# Patient Record
Sex: Female | Born: 1965 | Race: White | Hispanic: No | Marital: Married | Smoking: Never smoker
Health system: Southern US, Community
[De-identification: ages and names within clinical notes are randomized; demographics above are authoritative.]

## PROBLEM LIST (undated history)

## (undated) DIAGNOSIS — Z90710 Acquired absence of both cervix and uterus: Secondary | ICD-10-CM

## (undated) DIAGNOSIS — Z8249 Family history of ischemic heart disease and other diseases of the circulatory system: Secondary | ICD-10-CM

## (undated) DIAGNOSIS — R55 Syncope and collapse: Secondary | ICD-10-CM

## (undated) DIAGNOSIS — I951 Orthostatic hypotension: Secondary | ICD-10-CM

## (undated) HISTORY — DX: Acquired absence of both cervix and uterus: Z90.710

## (undated) HISTORY — DX: Family history of ischemic heart disease and other diseases of the circulatory system: Z82.49

## (undated) HISTORY — PX: TUBAL LIGATION: SHX77

## (undated) HISTORY — PX: TYMPANOSTOMY TUBE PLACEMENT: SHX32

## (undated) HISTORY — DX: Syncope and collapse: R55

## (undated) HISTORY — PX: TONSILLECTOMY: SHX5217

## (undated) HISTORY — DX: Orthostatic hypotension: I95.1

---

## 1998-12-22 ENCOUNTER — Ambulatory Visit (HOSPITAL_COMMUNITY): Admission: RE | Admit: 1998-12-22 | Discharge: 1998-12-22 | Payer: Self-pay | Admitting: Obstetrics and Gynecology

## 1999-08-07 ENCOUNTER — Other Ambulatory Visit: Admission: RE | Admit: 1999-08-07 | Discharge: 1999-08-07 | Payer: Self-pay | Admitting: Obstetrics and Gynecology

## 2000-09-10 ENCOUNTER — Other Ambulatory Visit: Admission: RE | Admit: 2000-09-10 | Discharge: 2000-09-10 | Payer: Self-pay | Admitting: Obstetrics and Gynecology

## 2001-09-24 ENCOUNTER — Other Ambulatory Visit: Admission: RE | Admit: 2001-09-24 | Discharge: 2001-09-24 | Payer: Self-pay | Admitting: Obstetrics and Gynecology

## 2002-11-24 ENCOUNTER — Other Ambulatory Visit: Admission: RE | Admit: 2002-11-24 | Discharge: 2002-11-24 | Payer: Self-pay | Admitting: Obstetrics and Gynecology

## 2003-12-26 ENCOUNTER — Other Ambulatory Visit: Admission: RE | Admit: 2003-12-26 | Discharge: 2003-12-26 | Payer: Self-pay | Admitting: Obstetrics and Gynecology

## 2004-05-17 ENCOUNTER — Observation Stay (HOSPITAL_COMMUNITY): Admission: RE | Admit: 2004-05-17 | Discharge: 2004-05-18 | Payer: Self-pay | Admitting: Obstetrics and Gynecology

## 2004-07-16 ENCOUNTER — Encounter: Admission: RE | Admit: 2004-07-16 | Discharge: 2004-07-16 | Payer: Self-pay | Admitting: Orthopedic Surgery

## 2004-07-26 ENCOUNTER — Encounter: Admission: RE | Admit: 2004-07-26 | Discharge: 2004-07-26 | Payer: Self-pay | Admitting: Orthopedic Surgery

## 2004-07-30 ENCOUNTER — Encounter: Admission: RE | Admit: 2004-07-30 | Discharge: 2004-07-30 | Payer: Self-pay | Admitting: Orthopedic Surgery

## 2004-11-19 ENCOUNTER — Encounter
Admission: RE | Admit: 2004-11-19 | Discharge: 2004-11-19 | Payer: Self-pay | Admitting: Physical Medicine and Rehabilitation

## 2004-12-31 ENCOUNTER — Encounter
Admission: RE | Admit: 2004-12-31 | Discharge: 2004-12-31 | Payer: Self-pay | Admitting: Physical Medicine and Rehabilitation

## 2005-02-06 ENCOUNTER — Other Ambulatory Visit: Admission: RE | Admit: 2005-02-06 | Discharge: 2005-02-06 | Payer: Self-pay | Admitting: Obstetrics and Gynecology

## 2005-06-21 ENCOUNTER — Encounter: Admission: RE | Admit: 2005-06-21 | Discharge: 2005-06-21 | Payer: Self-pay | Admitting: Gastroenterology

## 2010-10-07 ENCOUNTER — Encounter: Payer: Self-pay | Admitting: Physical Medicine and Rehabilitation

## 2012-02-08 ENCOUNTER — Encounter: Payer: Self-pay | Admitting: *Deleted

## 2012-05-25 ENCOUNTER — Encounter: Payer: Self-pay | Admitting: *Deleted

## 2015-05-16 ENCOUNTER — Other Ambulatory Visit: Payer: Self-pay | Admitting: Obstetrics and Gynecology

## 2015-05-24 LAB — CYTOLOGY - PAP

## 2016-04-10 DIAGNOSIS — K5900 Constipation, unspecified: Secondary | ICD-10-CM | POA: Diagnosis not present

## 2016-04-10 DIAGNOSIS — G4733 Obstructive sleep apnea (adult) (pediatric): Secondary | ICD-10-CM | POA: Diagnosis not present

## 2016-04-10 DIAGNOSIS — J4520 Mild intermittent asthma, uncomplicated: Secondary | ICD-10-CM | POA: Diagnosis not present

## 2016-04-10 DIAGNOSIS — J309 Allergic rhinitis, unspecified: Secondary | ICD-10-CM | POA: Diagnosis not present

## 2016-04-21 DIAGNOSIS — N3090 Cystitis, unspecified without hematuria: Secondary | ICD-10-CM | POA: Diagnosis not present

## 2016-04-21 DIAGNOSIS — N3001 Acute cystitis with hematuria: Secondary | ICD-10-CM | POA: Diagnosis not present

## 2016-07-05 DIAGNOSIS — Z01419 Encounter for gynecological examination (general) (routine) without abnormal findings: Secondary | ICD-10-CM | POA: Diagnosis not present

## 2016-07-05 DIAGNOSIS — Z1231 Encounter for screening mammogram for malignant neoplasm of breast: Secondary | ICD-10-CM | POA: Diagnosis not present

## 2016-07-05 DIAGNOSIS — Z13820 Encounter for screening for osteoporosis: Secondary | ICD-10-CM | POA: Diagnosis not present

## 2016-07-05 DIAGNOSIS — Z6821 Body mass index (BMI) 21.0-21.9, adult: Secondary | ICD-10-CM | POA: Diagnosis not present

## 2016-07-10 ENCOUNTER — Other Ambulatory Visit: Payer: Self-pay | Admitting: Obstetrics and Gynecology

## 2016-07-10 DIAGNOSIS — R928 Other abnormal and inconclusive findings on diagnostic imaging of breast: Secondary | ICD-10-CM

## 2016-07-12 DIAGNOSIS — Z23 Encounter for immunization: Secondary | ICD-10-CM | POA: Diagnosis not present

## 2016-07-17 ENCOUNTER — Ambulatory Visit
Admission: RE | Admit: 2016-07-17 | Discharge: 2016-07-17 | Disposition: A | Discharge status: Home / Self Care | Payer: Self-pay | Source: Ambulatory Visit | Attending: Obstetrics and Gynecology | Admitting: Obstetrics and Gynecology

## 2016-07-17 DIAGNOSIS — R928 Other abnormal and inconclusive findings on diagnostic imaging of breast: Secondary | ICD-10-CM

## 2016-07-17 DIAGNOSIS — R921 Mammographic calcification found on diagnostic imaging of breast: Secondary | ICD-10-CM | POA: Diagnosis not present

## 2016-08-21 DIAGNOSIS — G4733 Obstructive sleep apnea (adult) (pediatric): Secondary | ICD-10-CM | POA: Diagnosis not present

## 2016-08-30 DIAGNOSIS — M8589 Other specified disorders of bone density and structure, multiple sites: Secondary | ICD-10-CM | POA: Diagnosis not present

## 2016-08-30 DIAGNOSIS — M818 Other osteoporosis without current pathological fracture: Secondary | ICD-10-CM | POA: Diagnosis not present

## 2016-09-10 DIAGNOSIS — G8929 Other chronic pain: Secondary | ICD-10-CM | POA: Diagnosis not present

## 2016-09-10 DIAGNOSIS — M545 Low back pain: Secondary | ICD-10-CM | POA: Diagnosis not present

## 2016-09-26 DIAGNOSIS — G8929 Other chronic pain: Secondary | ICD-10-CM | POA: Diagnosis not present

## 2016-09-26 DIAGNOSIS — M545 Low back pain: Secondary | ICD-10-CM | POA: Diagnosis not present

## 2016-10-04 DIAGNOSIS — M545 Low back pain: Secondary | ICD-10-CM | POA: Diagnosis not present

## 2016-10-04 DIAGNOSIS — G8929 Other chronic pain: Secondary | ICD-10-CM | POA: Diagnosis not present

## 2016-10-14 DIAGNOSIS — M545 Low back pain: Secondary | ICD-10-CM | POA: Diagnosis not present

## 2016-10-14 DIAGNOSIS — G8929 Other chronic pain: Secondary | ICD-10-CM | POA: Diagnosis not present

## 2016-10-21 DIAGNOSIS — G8929 Other chronic pain: Secondary | ICD-10-CM | POA: Diagnosis not present

## 2016-10-21 DIAGNOSIS — M545 Low back pain: Secondary | ICD-10-CM | POA: Diagnosis not present

## 2016-10-30 DIAGNOSIS — M545 Low back pain: Secondary | ICD-10-CM | POA: Diagnosis not present

## 2016-10-30 DIAGNOSIS — G8929 Other chronic pain: Secondary | ICD-10-CM | POA: Diagnosis not present

## 2016-11-07 DIAGNOSIS — N3000 Acute cystitis without hematuria: Secondary | ICD-10-CM | POA: Diagnosis not present

## 2016-11-07 DIAGNOSIS — Z1389 Encounter for screening for other disorder: Secondary | ICD-10-CM | POA: Diagnosis not present

## 2016-11-07 DIAGNOSIS — Z800 Family history of malignant neoplasm of digestive organs: Secondary | ICD-10-CM | POA: Diagnosis not present

## 2016-11-07 DIAGNOSIS — Z0000 Encounter for general adult medical examination without abnormal findings: Secondary | ICD-10-CM | POA: Diagnosis not present

## 2016-12-24 DIAGNOSIS — G4733 Obstructive sleep apnea (adult) (pediatric): Secondary | ICD-10-CM | POA: Diagnosis not present

## 2016-12-27 DIAGNOSIS — G4733 Obstructive sleep apnea (adult) (pediatric): Secondary | ICD-10-CM | POA: Diagnosis not present

## 2017-04-22 DIAGNOSIS — G4733 Obstructive sleep apnea (adult) (pediatric): Secondary | ICD-10-CM | POA: Diagnosis not present

## 2017-05-08 DIAGNOSIS — K219 Gastro-esophageal reflux disease without esophagitis: Secondary | ICD-10-CM | POA: Diagnosis not present

## 2017-05-08 DIAGNOSIS — T17208A Unspecified foreign body in pharynx causing other injury, initial encounter: Secondary | ICD-10-CM | POA: Diagnosis not present

## 2017-05-13 DIAGNOSIS — M7712 Lateral epicondylitis, left elbow: Secondary | ICD-10-CM | POA: Diagnosis not present

## 2017-07-02 DIAGNOSIS — G4736 Sleep related hypoventilation in conditions classified elsewhere: Secondary | ICD-10-CM | POA: Diagnosis not present

## 2017-07-02 DIAGNOSIS — G4733 Obstructive sleep apnea (adult) (pediatric): Secondary | ICD-10-CM | POA: Diagnosis not present

## 2017-07-03 DIAGNOSIS — J309 Allergic rhinitis, unspecified: Secondary | ICD-10-CM | POA: Diagnosis not present

## 2017-07-03 DIAGNOSIS — M7712 Lateral epicondylitis, left elbow: Secondary | ICD-10-CM | POA: Diagnosis not present

## 2017-07-03 DIAGNOSIS — Z681 Body mass index (BMI) 19 or less, adult: Secondary | ICD-10-CM | POA: Diagnosis not present

## 2017-07-03 DIAGNOSIS — G4733 Obstructive sleep apnea (adult) (pediatric): Secondary | ICD-10-CM | POA: Diagnosis not present

## 2017-08-13 DIAGNOSIS — Z9989 Dependence on other enabling machines and devices: Secondary | ICD-10-CM | POA: Diagnosis not present

## 2017-08-13 DIAGNOSIS — G4733 Obstructive sleep apnea (adult) (pediatric): Secondary | ICD-10-CM | POA: Diagnosis not present

## 2017-08-13 DIAGNOSIS — J342 Deviated nasal septum: Secondary | ICD-10-CM | POA: Diagnosis not present

## 2017-08-13 DIAGNOSIS — K219 Gastro-esophageal reflux disease without esophagitis: Secondary | ICD-10-CM | POA: Diagnosis not present

## 2017-09-18 DIAGNOSIS — L821 Other seborrheic keratosis: Secondary | ICD-10-CM | POA: Diagnosis not present

## 2017-09-18 DIAGNOSIS — L814 Other melanin hyperpigmentation: Secondary | ICD-10-CM | POA: Diagnosis not present

## 2017-09-18 DIAGNOSIS — D225 Melanocytic nevi of trunk: Secondary | ICD-10-CM | POA: Diagnosis not present

## 2017-11-09 DIAGNOSIS — G4733 Obstructive sleep apnea (adult) (pediatric): Secondary | ICD-10-CM | POA: Diagnosis not present

## 2017-11-09 DIAGNOSIS — G4736 Sleep related hypoventilation in conditions classified elsewhere: Secondary | ICD-10-CM | POA: Diagnosis not present

## 2017-11-25 DIAGNOSIS — Z681 Body mass index (BMI) 19 or less, adult: Secondary | ICD-10-CM | POA: Diagnosis not present

## 2017-11-25 DIAGNOSIS — Z01419 Encounter for gynecological examination (general) (routine) without abnormal findings: Secondary | ICD-10-CM | POA: Diagnosis not present

## 2017-11-25 DIAGNOSIS — Z1231 Encounter for screening mammogram for malignant neoplasm of breast: Secondary | ICD-10-CM | POA: Diagnosis not present

## 2018-02-17 DIAGNOSIS — G4733 Obstructive sleep apnea (adult) (pediatric): Secondary | ICD-10-CM | POA: Diagnosis not present

## 2018-04-05 IMAGING — MG 2D DIGITAL DIAGNOSTIC UNILATERAL RIGHT MAMMOGRAM WITH CAD AND AD
8 of 19 series · 8 of 39 positions shown · non-contrast
Comparison: Previous exam(s).

CLINICAL DATA: Right breast calcifications, possible mass and
possible asymmetry on a recent screening mammogram.

EXAM:
2D DIGITAL DIAGNOSTIC UNILATERAL RIGHT MAMMOGRAM WITH CAD AND
ADJUNCT TOMO

[R CC (1 of 4)]
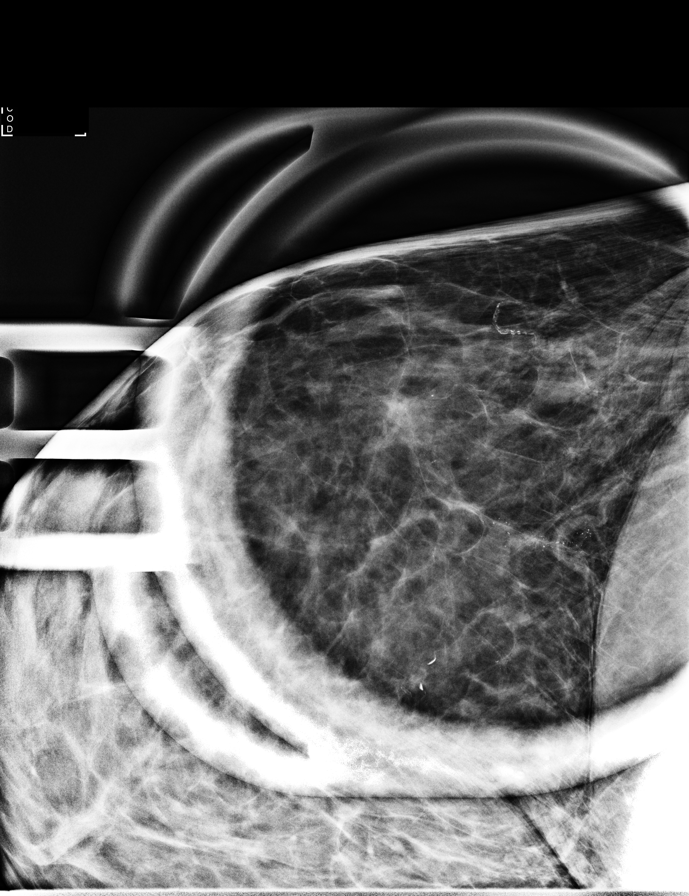

[R ML (1 of 2)]
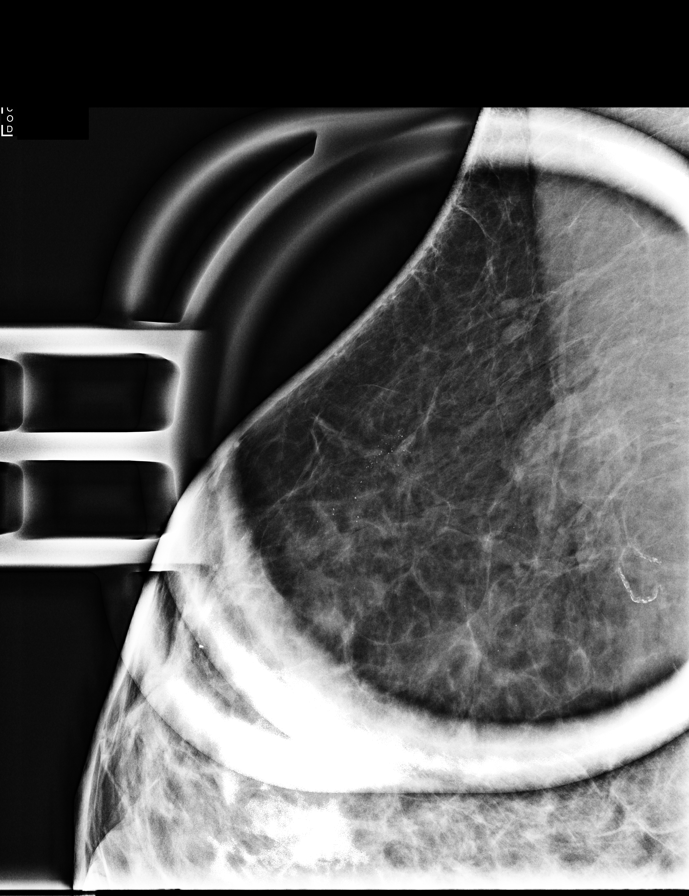

[R CC (2 of 4)]
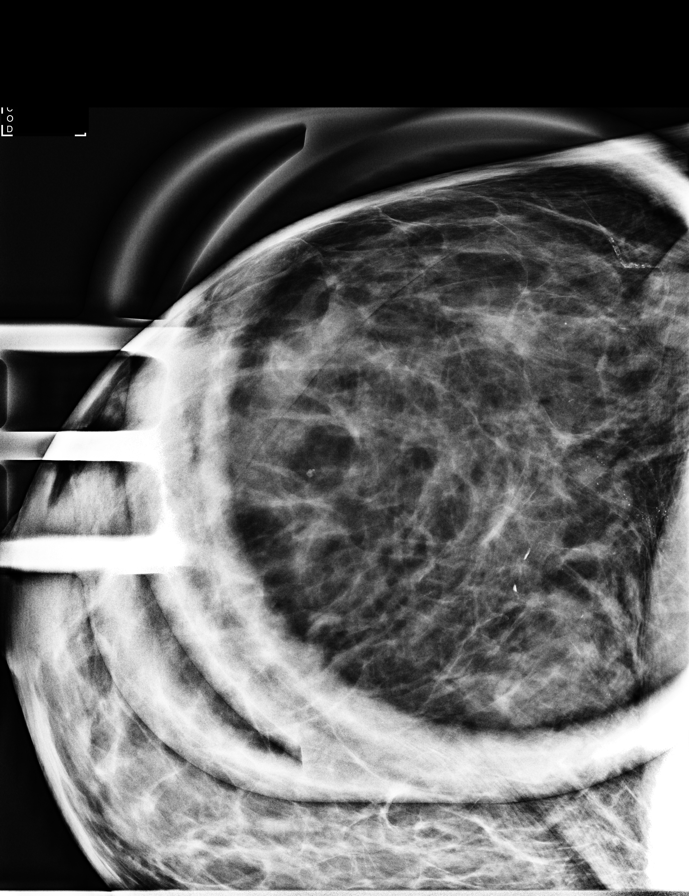

[R ML (2 of 2)]
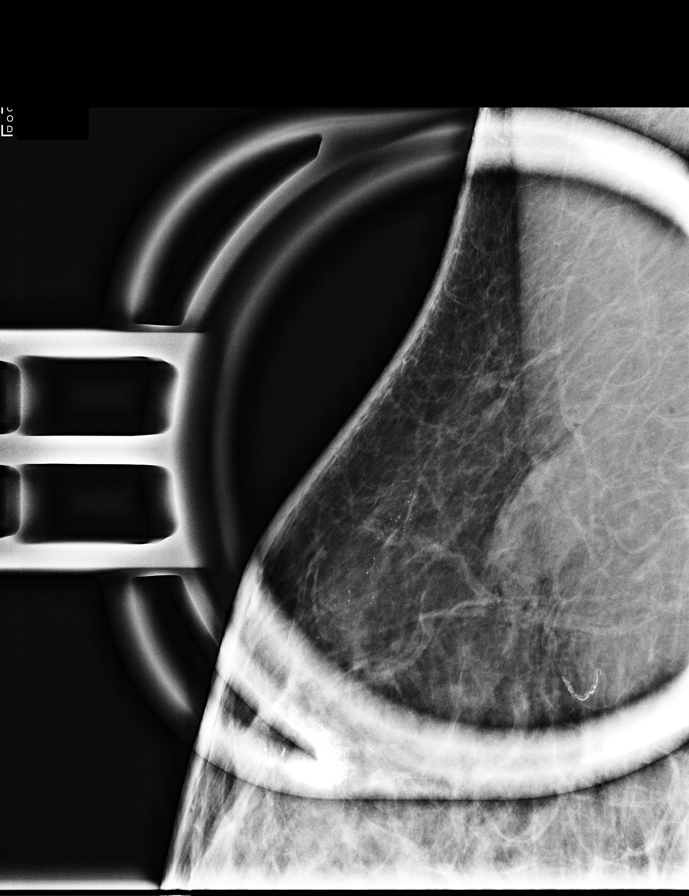

[R CC (3 of 4)]
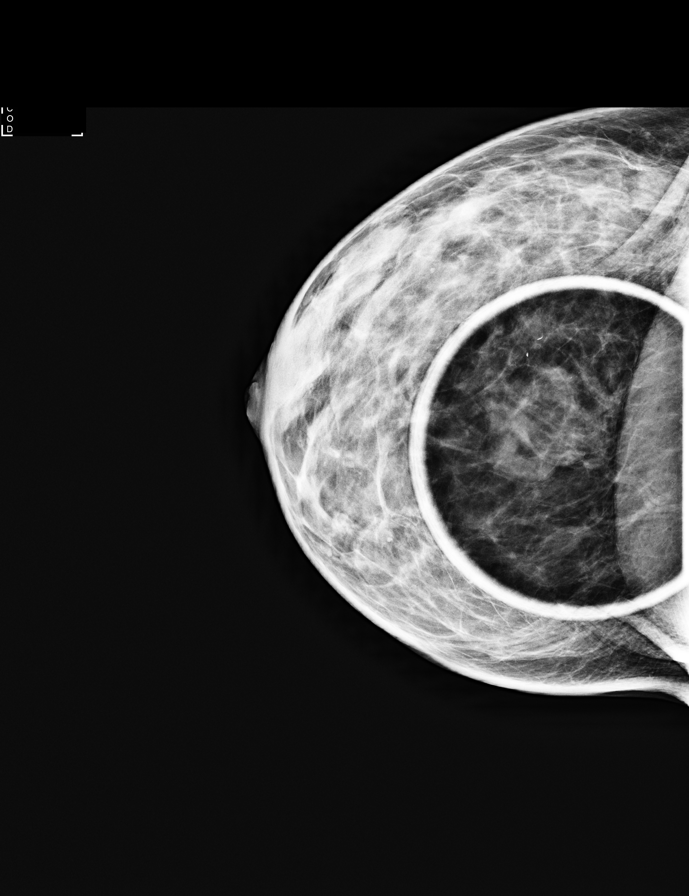

[R MLO]
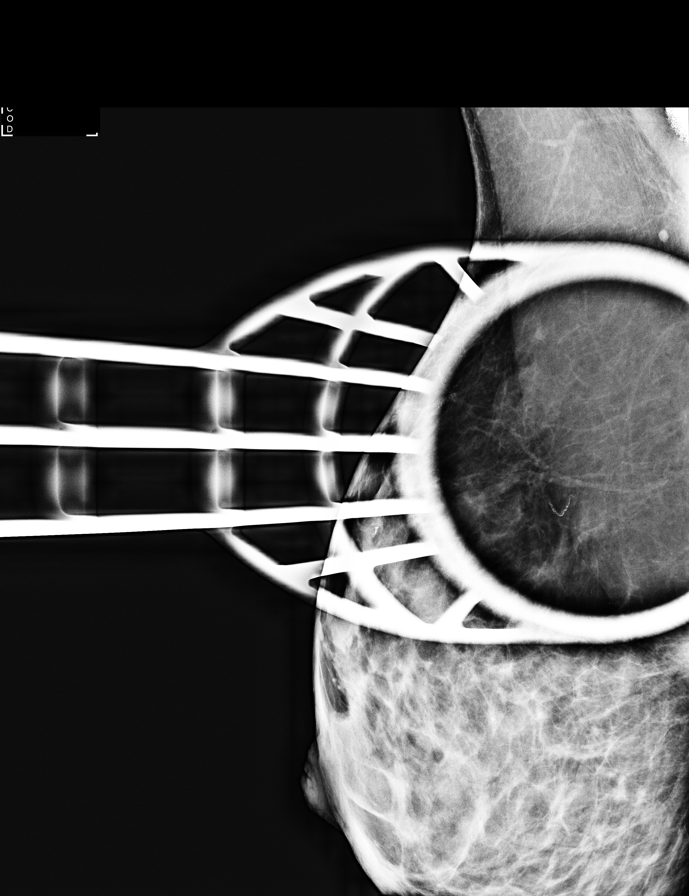

[R CC (4 of 4)]
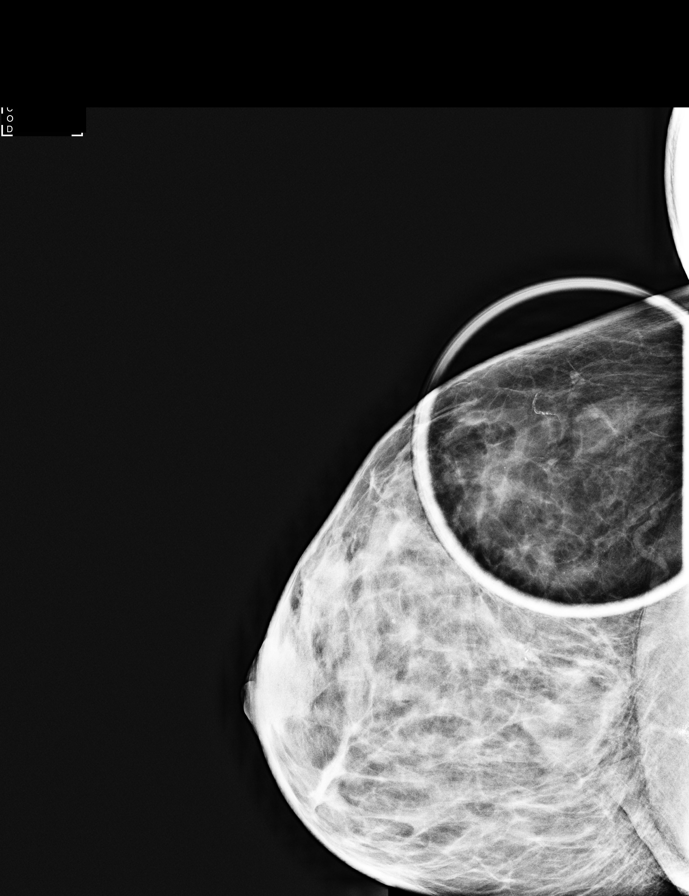

[R ML synth-2D]
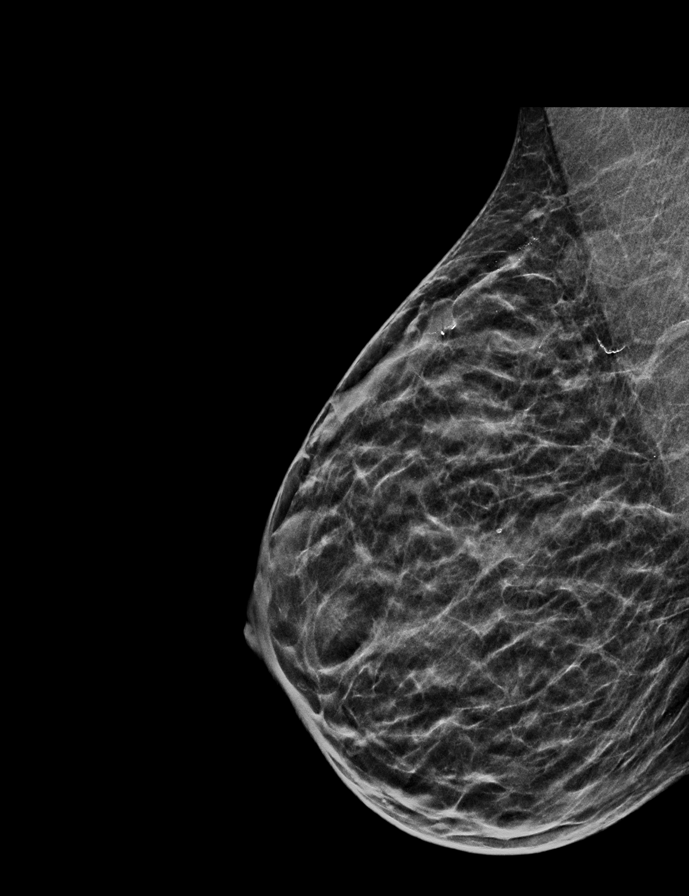

[8 of 39 positions shown; findings below may reference images not displayed]

ACR Breast Density Category c: The breast tissue is heterogeneously
dense, which may obscure small masses.
FINDINGS: 2D and 3D tomographic true lateral and spot compression views of the
right breast were obtained as well as 2D spot magnification views.
The recently suspected right breast mass and asymmetry have the
appearances of normal fibroglandular tissue with interspersed fat,
without significant change compared to previous examinations dating
back to 7775. The calcifications seen in the posterior aspect of the
upper-outer portion of the breast are punctate and rounded, with
similar appearing scattered calcifications in both breasts, without
significant change since 04/27/2008. There are no new findings
suspicious for malignancy.

Mammographic images were processed with CAD.
IMPRESSION: 1. The recently suspected right breast mass and asymmetry were areas
of close apposition of normal fibroglandular tissue and a possible
hamartoma.
2. Benign right breast calcifications, unchanged since 04/27/2008.
[DATE]. No evidence of malignancy.

RECOMMENDATION:
Bilateral screening mammogram in 1 year.

I have discussed the findings and recommendations with the patient.
Results were also provided in writing at the conclusion of the
visit. If applicable, a reminder letter will be sent to the patient
regarding the next appointment.

BI-RADS CATEGORY  2: Benign.

## 2018-07-02 DIAGNOSIS — G4733 Obstructive sleep apnea (adult) (pediatric): Secondary | ICD-10-CM | POA: Diagnosis not present

## 2018-07-22 DIAGNOSIS — I7300 Raynaud's syndrome without gangrene: Secondary | ICD-10-CM | POA: Diagnosis not present

## 2018-07-22 DIAGNOSIS — Z681 Body mass index (BMI) 19 or less, adult: Secondary | ICD-10-CM | POA: Diagnosis not present

## 2018-07-22 DIAGNOSIS — Z2821 Immunization not carried out because of patient refusal: Secondary | ICD-10-CM | POA: Diagnosis not present

## 2018-07-22 DIAGNOSIS — J45909 Unspecified asthma, uncomplicated: Secondary | ICD-10-CM | POA: Diagnosis not present

## 2018-10-02 DIAGNOSIS — G4733 Obstructive sleep apnea (adult) (pediatric): Secondary | ICD-10-CM | POA: Diagnosis not present

## 2018-12-03 DIAGNOSIS — M778 Other enthesopathies, not elsewhere classified: Secondary | ICD-10-CM | POA: Diagnosis not present

## 2018-12-03 DIAGNOSIS — Z681 Body mass index (BMI) 19 or less, adult: Secondary | ICD-10-CM | POA: Diagnosis not present

## 2018-12-30 DIAGNOSIS — R300 Dysuria: Secondary | ICD-10-CM | POA: Diagnosis not present

## 2018-12-30 DIAGNOSIS — N3090 Cystitis, unspecified without hematuria: Secondary | ICD-10-CM | POA: Diagnosis not present

## 2019-02-03 DIAGNOSIS — G4733 Obstructive sleep apnea (adult) (pediatric): Secondary | ICD-10-CM | POA: Diagnosis not present

## 2019-02-14 DIAGNOSIS — J019 Acute sinusitis, unspecified: Secondary | ICD-10-CM | POA: Diagnosis not present

## 2019-02-14 DIAGNOSIS — Z20828 Contact with and (suspected) exposure to other viral communicable diseases: Secondary | ICD-10-CM | POA: Diagnosis not present

## 2019-03-29 ENCOUNTER — Ambulatory Visit (INDEPENDENT_AMBULATORY_CARE_PROVIDER_SITE_OTHER): Payer: BC Managed Care – PPO

## 2019-03-29 ENCOUNTER — Encounter: Payer: Self-pay | Admitting: Pulmonary Disease

## 2019-03-29 ENCOUNTER — Ambulatory Visit: Payer: BC Managed Care – PPO | Admitting: Pulmonary Disease

## 2019-03-29 ENCOUNTER — Other Ambulatory Visit: Payer: Self-pay

## 2019-03-29 VITALS — BP 118/64 | HR 57 | Ht 63.5 in | Wt 115.0 lb

## 2019-03-29 DIAGNOSIS — R002 Palpitations: Secondary | ICD-10-CM

## 2019-03-29 DIAGNOSIS — G4733 Obstructive sleep apnea (adult) (pediatric): Secondary | ICD-10-CM | POA: Diagnosis not present

## 2019-03-29 NOTE — Progress Notes (Signed)
Subjective:    Patient ID: Kathryn Potts, female    DOB: 12/04/1965, 53 y.o.   MRN: [id number]  Patient being seen for obstructive sleep apnea  Complains of difficulty maintaining sleep Feels her sleep apnea is worse than what it was in the past Compliant with CPAP use  Complains about shortness of breath Has multiple allergies No diagnosed lung disease Complains about palpitations at night Wakes up multiple times when she is trying to fall asleep  She goes to bed between 7 PM and 12 Usual wake up time about 12 to do the third shift Weight is down about 10 pounds in the last couple years  Never smoked No occupational predisposition to lung disease Works on an Theatre stage managerassembly line  She has shortness of breath at rest, regular heartbeats  Denies a history of depression/anxiety   Review of Systems  Constitutional: Negative for fever and unexpected weight change.  HENT: Positive for dental problem, ear pain, sinus pressure and sore throat. Negative for congestion, nosebleeds, postnasal drip, rhinorrhea, sneezing and trouble swallowing.   Eyes: Negative for redness and itching.  Respiratory: Positive for chest tightness and shortness of breath. Negative for cough and wheezing.   Cardiovascular: Positive for palpitations. Negative for leg swelling.  Gastrointestinal: Positive for nausea. Negative for vomiting.  Genitourinary: Negative for dysuria.  Musculoskeletal: Negative for joint swelling.  Skin: Negative for rash.  Allergic/Immunologic: Positive for environmental allergies. Negative for food allergies and immunocompromised state.  Neurological: Positive for headaches.  Hematological: Does not bruise/bleed easily.  Psychiatric/Behavioral: Negative for dysphoric mood. The patient is not nervous/anxious.    Past Medical History:  Diagnosis Date  . Family history of premature CAD   . History of hysterectomy   . Orthostatic hypotension   . Syncope    Social History    Socioeconomic History  . Marital status: Married    Spouse name: Not on file  . Number of children: Not on file  . Years of education: Not on file  . Highest education level: Not on file  Occupational History  . Not on file  Social Needs  . Financial resource strain: Not on file  . Food insecurity    Worry: Not on file    Inability: Not on file  . Transportation needs    Medical: Not on file    Non-medical: Not on file  Tobacco Use  . Smoking status: Never Smoker  . Smokeless tobacco: Never Used  Substance and Sexual Activity  . Alcohol use: Not on file  . Drug use: Not on file  . Sexual activity: Not on file  Lifestyle  . Physical activity    Days per week: Not on file    Minutes per session: Not on file  . Stress: Not on file  Relationships  . Social Musicianconnections    Talks on phone: Not on file    Gets together: Not on file    Attends religious service: Not on file    Active member of club or organization: Not on file    Attends meetings of clubs or organizations: Not on file    Relationship status: Not on file  . Intimate partner violence    Fear of current or ex partner: Not on file    Emotionally abused: Not on file    Physically abused: Not on file    Forced sexual activity: Not on file  Other Topics Concern  . Not on file  Social History Narrative  . Not  on file   Family History  Problem Relation Age of Onset  . Coronary artery disease Father        in his 39's -- with CABG in his late 48's  . Hypertension Father   . Stroke Father   . Diabetes Father        Objective:   Physical Exam Constitutional:      Appearance: Normal appearance.  HENT:     Head: Normocephalic and atraumatic.  Eyes:     Pupils: Pupils are equal, round, and reactive to light.  Neck:     Musculoskeletal: Normal range of motion and neck supple. No neck rigidity or muscular tenderness.  Cardiovascular:     Rate and Rhythm: Normal rate.     Heart sounds: No murmur.   Pulmonary:     Effort: Pulmonary effort is normal. No respiratory distress.     Breath sounds: Normal breath sounds. No stridor. No wheezing or rhonchi.  Abdominal:     General: Abdomen is flat. There is no distension.     Palpations: There is no mass.     Tenderness: There is no abdominal tenderness.  Skin:    General: Skin is warm and dry.     Coloration: Skin is not jaundiced or pale.  Neurological:     General: No focal deficit present.     Mental Status: She is alert.  Psychiatric:        Mood and Affect: Mood normal.    Vitals:   03/29/19 0909  BP: 118/64  Pulse: (!) 57  SpO2: 97%   Assessment: Obstructive sleep apnea -Compliant with CPAP use -Does have some palpitations, shortness of breath at night -Feels admission may not be working optimally  .  Shortness of breath -This happens even at rest -She feels she has poor circulation  .  Multiple allergies -She uses albuterol as needed but does not have diagnosed airway hyperresponsiveness   I believe she may have a component of anxiety contributing to symptoms  Plan: Obtain a PFT Obtain a chest x-ray  Reluctant to have a sleep study at the present time -She stated she will consider it depending on the results of PFT and chest x-ray  We will contact [facility] to get a download from her current CPAP  We will see her back in the office in about a month

## 2019-03-29 NOTE — Patient Instructions (Signed)
History of obstructive sleep apnea Multiple awakenings at night Palpitations at night  We will obtain a download from your machine from [facility] a chest x-ray Obtain a pulmonary function study  We will see you back in the office in about a month

## 2019-04-02 ENCOUNTER — Telehealth: Payer: Self-pay | Admitting: Pulmonary Disease

## 2019-04-02 NOTE — Telephone Encounter (Signed)
Pt is calling back [phone]

## 2019-04-02 NOTE — Telephone Encounter (Signed)
I attempted to call patient with these results. At the time of call, pt's spouse answered, however he is not on pt's DPR, therefore, I left a message with him to have pt call our office back. LMTCB X1.

## 2019-04-02 NOTE — Telephone Encounter (Signed)
Called & spoke w/ pt regarding TP's results and recommendations. Pt verbalized understanding and inquired on making a f/u appointment w/ AO as she was not able to at her LOV. According to pt's AVS, AO requested she return in 1 mo with a PFT. I have scheduled her f/u appt for 05/03/2019 at 10:30 and will route this message to [facility] to possibly get a PFT scheduled beforehand.   Routing to [employer] as an Pharmacist, hospital. Nothing further needed at this time.

## 2019-04-02 NOTE — Telephone Encounter (Signed)
Chest xray looks okay with no acute process Cont with ov recs from Dr. Ander Slade  Please contact office for sooner follow up if symptoms do not improve or worsen or seek emergency care

## 2019-04-02 NOTE — Telephone Encounter (Signed)
Pt requesting results of cxr done on 7/13. Pt saw AO in clinic.

## 2019-04-07 NOTE — Telephone Encounter (Signed)
Patrice aware patient needs PFT scheduling. Call made to patient to make aware she is on the list and we will contact her. Voiced understanding.

## 2019-05-03 ENCOUNTER — Ambulatory Visit: Payer: BC Managed Care – PPO | Admitting: Pulmonary Disease

## 2019-06-07 NOTE — Telephone Encounter (Signed)
Noted. Patrice aware PFT needs scheduling.  

## 2019-06-08 DIAGNOSIS — Z23 Encounter for immunization: Secondary | ICD-10-CM | POA: Diagnosis not present

## 2019-06-09 ENCOUNTER — Telehealth: Payer: Self-pay | Admitting: Pulmonary Disease

## 2019-06-10 NOTE — Telephone Encounter (Signed)
L/m on pt vm to call back to schedule pft -pr  °

## 2019-06-11 NOTE — Telephone Encounter (Signed)
L/m on pt vm to call back to schedule pft - 3rd attempt - closed encounter -pr  °

## 2019-08-16 DIAGNOSIS — G4733 Obstructive sleep apnea (adult) (pediatric): Secondary | ICD-10-CM | POA: Diagnosis not present

## 2020-08-04 ENCOUNTER — Ambulatory Visit (INDEPENDENT_AMBULATORY_CARE_PROVIDER_SITE_OTHER): Payer: 59 | Admitting: Otolaryngology

## 2020-08-04 ENCOUNTER — Encounter (INDEPENDENT_AMBULATORY_CARE_PROVIDER_SITE_OTHER): Payer: Self-pay | Admitting: Otolaryngology

## 2020-08-04 ENCOUNTER — Other Ambulatory Visit: Payer: Self-pay

## 2020-08-04 VITALS — Temp 97.9°F

## 2020-08-04 DIAGNOSIS — R221 Localized swelling, mass and lump, neck: Secondary | ICD-10-CM | POA: Diagnosis not present

## 2020-08-04 NOTE — Progress Notes (Signed)
HPI: Kathryn Potts is a 54 y.o. female who presents for evaluation of some mental mass that she is noted for a couple months.  She states that it varies in size.  Sometimes it swells up and then other times it goes down. She has history of obstructive sleep apnea and uses a CPAP machine at night.  More recently has had more difficulty using the CPAP machine and more difficulty breathing and wondered if the knot underneath her chin is contributing to this.  She is also had previous history of torse mandibularis removed.  Past Medical History:  Diagnosis Date  . Family history of premature CAD   . History of hysterectomy   . Orthostatic hypotension   . Syncope    Past Surgical History:  Procedure Laterality Date  . TONSILLECTOMY    . TUBAL LIGATION    . TYMPANOSTOMY TUBE PLACEMENT     Social History   Socioeconomic History  . Marital status: Married    Spouse name: Not on file  . Number of children: Not on file  . Years of education: Not on file  . Highest education level: Not on file  Occupational History  . Not on file  Tobacco Use  . Smoking status: Never Smoker  . Smokeless tobacco: Never Used  Substance and Sexual Activity  . Alcohol use: Not on file  . Drug use: Not on file  . Sexual activity: Not on file  Other Topics Concern  . Not on file  Social History Narrative  . Not on file   Social Determinants of Health   Financial Resource Strain:   . Difficulty of Paying Living Expenses: Not on file  Food Insecurity:   . Worried About Programme researcher, broadcasting/film/video in the Last Year: Not on file  . Ran Out of Food in the Last Year: Not on file  Transportation Needs:   . Lack of Transportation (Medical): Not on file  . Lack of Transportation (Non-Medical): Not on file  Physical Activity:   . Days of Exercise per Week: Not on file  . Minutes of Exercise per Session: Not on file  Stress:   . Feeling of Stress : Not on file  Social Connections:   . Frequency of Communication  with Friends and Family: Not on file  . Frequency of Social Gatherings with Friends and Family: Not on file  . Attends Religious Services: Not on file  . Active Member of Clubs or Organizations: Not on file  . Attends Banker Meetings: Not on file  . Marital Status: Not on file   Family History  Problem Relation Age of Onset  . Coronary artery disease Father        in his 23's -- with CABG in his late 44's  . Hypertension Father   . Stroke Father   . Diabetes Father    No Known Allergies Prior to Admission medications   Medication Sig Start Date End Date Taking? Authorizing Provider  albuterol (VENTOLIN HFA) 108 (90 Base) MCG/ACT inhaler Inhale into the lungs every 6 (six) hours as needed for wheezing or shortness of breath. As needed   Yes [provider]  fexofenadine (ALLEGRA) 180 MG tablet Take 180 mg by mouth daily.   Yes [provider]  linaclotide (LINZESS) 72 MCG capsule Take 72 mcg by mouth daily before breakfast. As needed   Yes [provider]  montelukast (SINGULAIR) 10 MG tablet Take 10 mg by mouth at bedtime.  Yes [provider]  sodium bicarbonate 325 MG tablet Take 325 mg by mouth 3 (three) times daily.   Yes [provider]     Positive ROS: Otherwise negative  All other systems have been reviewed and were otherwise negative with the exception of those mentioned in the HPI and as above.  Physical Exam: Constitutional: Alert, well-appearing, no acute distress Ears: External ears without lesions or tenderness. Ear canals are clear bilaterally with intact, clear TMs.  Nasal: External nose without lesions. Septum midline with mild rhinitis. Clear nasal passages otherwise with no obstruction. Oral: Lips and gums without lesions. Tongue and palate mucosa without lesions. Posterior oropharynx clear.  Status post tonsillectomy.  Indirect laryngoscopy revealed a clear hypopharynx and larynx.  Clear base of tongue.   Floor of mouth was normal to evaluation and palpation of floor of mouth soft.  Remaining oral mucosa was normal to evaluation. Neck: She has a subminimal nodule consistent with probable lymph node measuring 1-1/2 cm in size.  Could possibly be a cyst. Respiratory: Breathing comfortably  Skin: No facial/neck lesions or rash noted.  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  It is well above the hyoid bone.  Plan: We will plan on obtaining a CT scan to better evaluate the submental mass.  She will call us after the CT scan to review this or follow-up in the office to show her this.  I do not feel like this is contributing to her sleep apnea and discussed this with her.  Narda Bonds, MD

## 2020-08-07 ENCOUNTER — Other Ambulatory Visit (INDEPENDENT_AMBULATORY_CARE_PROVIDER_SITE_OTHER): Payer: Self-pay

## 2020-08-07 DIAGNOSIS — R221 Localized swelling, mass and lump, neck: Secondary | ICD-10-CM

## 2020-08-07 NOTE — Progress Notes (Signed)
C-

## 2020-10-19 ENCOUNTER — Telehealth (INDEPENDENT_AMBULATORY_CARE_PROVIDER_SITE_OTHER): Payer: Self-pay

## 2024-06-09 NOTE — Telephone Encounter (Signed)
 I tried to call pt due to seeing her CT scan of neck had never been scheduled. Mobile [phone] could not be completed and home [phone] is out of service. P/A is good til 11/05/2020 for [facility].
